# Patient Record
Sex: Male | Born: 1975 | Race: White | Hispanic: No | Marital: Married | Smoking: Former smoker
Health system: Southern US, Community
[De-identification: ages and names within clinical notes are randomized; demographics above are authoritative.]

## PROBLEM LIST (undated history)

## (undated) DIAGNOSIS — Z87442 Personal history of urinary calculi: Secondary | ICD-10-CM

## (undated) DIAGNOSIS — K219 Gastro-esophageal reflux disease without esophagitis: Secondary | ICD-10-CM

## (undated) DIAGNOSIS — I499 Cardiac arrhythmia, unspecified: Secondary | ICD-10-CM

## (undated) HISTORY — PX: EYE SURGERY: SHX253

## (undated) HISTORY — PX: KNEE SURGERY: SHX244

## (undated) HISTORY — PX: LITHOTRIPSY: SUR834

---

## 2010-05-18 ENCOUNTER — Ambulatory Visit: Payer: Self-pay

## 2010-05-26 ENCOUNTER — Ambulatory Visit: Payer: Self-pay | Admitting: Orthopedic Surgery

## 2011-03-20 ENCOUNTER — Ambulatory Visit: Payer: Self-pay | Admitting: Internal Medicine

## 2011-09-30 ENCOUNTER — Emergency Department: Payer: Self-pay | Admitting: Internal Medicine

## 2011-09-30 LAB — BASIC METABOLIC PANEL
Anion Gap: 11 (ref 7–16)
Calcium, Total: 8.8 mg/dL (ref 8.5–10.1)
Co2: 23 mmol/L (ref 21–32)
Creatinine: 1.01 mg/dL (ref 0.60–1.30)
EGFR (African American): >60
EGFR (Non-African Amer.): >60
Osmolality: 281 (ref 275–301)

## 2011-09-30 LAB — URINALYSIS, COMPLETE
Bacteria: NONE SEEN
Ketone: NEGATIVE
Leukocyte Esterase: NEGATIVE
Nitrite: NEGATIVE
Ph: 5 (ref 4.5–8.0)
Protein: NEGATIVE
RBC,UR: 6 /HPF (ref 0–5)
WBC UR: 2 /HPF (ref 0–5)

## 2011-09-30 LAB — CBC
MCH: 29 pg (ref 26.0–34.0)
MCHC: 33.9 g/dL (ref 32.0–36.0)
MCV: 86 fL (ref 80–100)
Platelet: 217 10*3/uL (ref 150–440)
RDW: 12.9 % (ref 11.5–14.5)

## 2011-10-16 ENCOUNTER — Ambulatory Visit: Payer: Self-pay | Admitting: Urology

## 2011-10-18 ENCOUNTER — Ambulatory Visit: Payer: Self-pay | Admitting: Urology

## 2011-10-18 ENCOUNTER — Emergency Department: Payer: Self-pay | Admitting: Emergency Medicine

## 2011-10-19 LAB — URINALYSIS, COMPLETE
Bacteria: NONE SEEN
Glucose,UR: NEGATIVE mg/dL (ref 0–75)
Ketone: NEGATIVE
Nitrite: NEGATIVE
RBC,UR: 18 /HPF (ref 0–5)
Specific Gravity: 1.005 (ref 1.003–1.030)
WBC UR: 1 /HPF (ref 0–5)

## 2011-10-19 LAB — CBC
HGB: 12.8 g/dL — ABNORMAL LOW (ref 13.0–18.0)
MCH: 27.9 pg (ref 26.0–34.0)
MCV: 85 fL (ref 80–100)
Platelet: 208 10*3/uL (ref 150–440)
RBC: 4.59 10*6/uL (ref 4.40–5.90)
RDW: 12.7 % (ref 11.5–14.5)

## 2011-10-19 LAB — COMPREHENSIVE METABOLIC PANEL
Albumin: 3.6 g/dL (ref 3.4–5.0)
Alkaline Phosphatase: 75 U/L (ref 50–136)
BUN: 15 mg/dL (ref 7–18)
Calcium, Total: 8.8 mg/dL (ref 8.5–10.1)
Co2: 27 mmol/L (ref 21–32)
Creatinine: 1.18 mg/dL (ref 0.60–1.30)
EGFR (Non-African Amer.): >60
Glucose: 119 mg/dL — ABNORMAL HIGH (ref 65–99)
Osmolality: 274 (ref 275–301)
SGPT (ALT): 43 U/L
Sodium: 136 mmol/L (ref 136–145)
Total Protein: 7.1 g/dL (ref 6.4–8.2)

## 2011-10-19 LAB — LIPASE, BLOOD: Lipase: 96 U/L (ref 73–393)

## 2013-09-22 IMAGING — CT CT STONE STUDY
1 of 2 series · 15 of 32 positions shown, 19 images · non-contrast
Comparison: none

REASON FOR EXAM: acute left flank pain radiating to testicle
COMMENTS:

PROCEDURE:     CT  - CT ABDOMEN /PELVIS WO (STONE)  - September 30, 2011  [DATE]
RESULT:     Comparison: None
TECHNIQUE: Multiple axial images from the lung bases to the symphysis pubis
were obtained without oral and without intravenous contrast.

[Series 2: 3mm soft tissue · axial · 0.79mm/px · z∈[-1126,-652]mm · 15 of 174 slices shown, 19 images]
[im 8/174  soft-tissue]
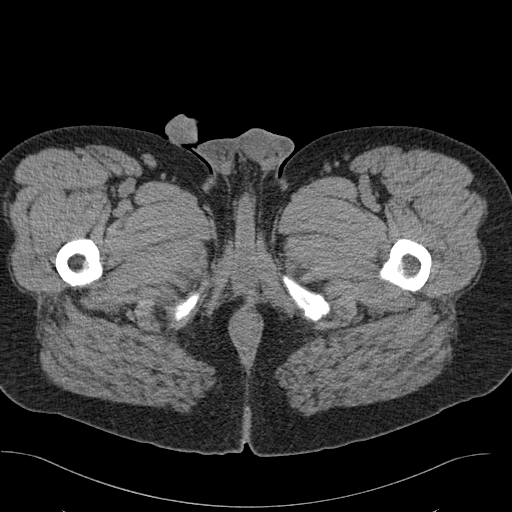
[im 8/174  bone]
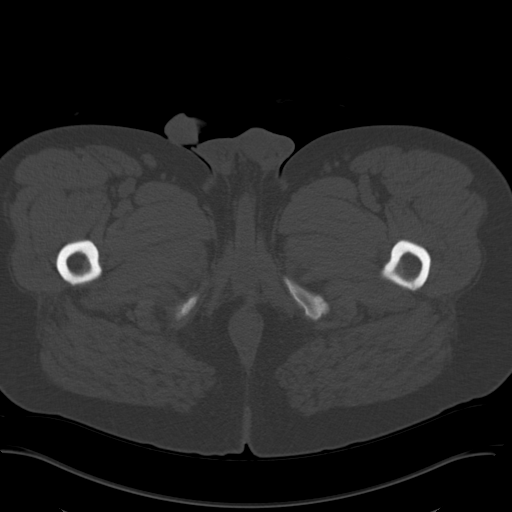
[im 22/174  soft-tissue]
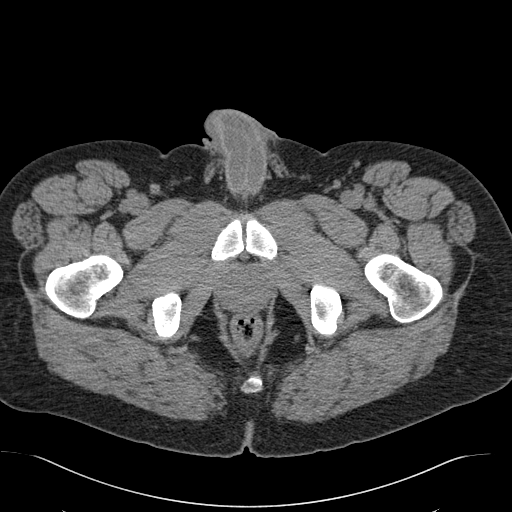
[im 37/174  soft-tissue]
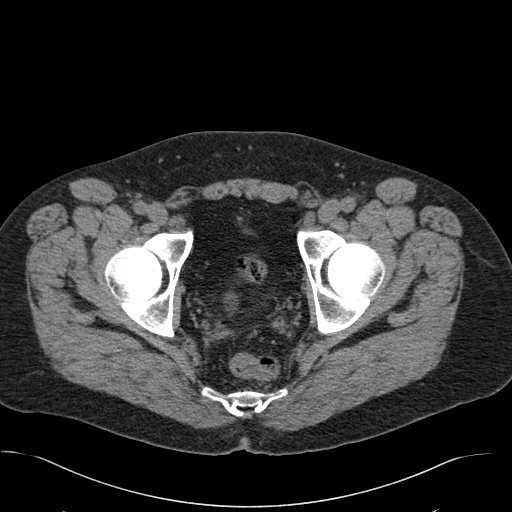
[im 51/174  soft-tissue]
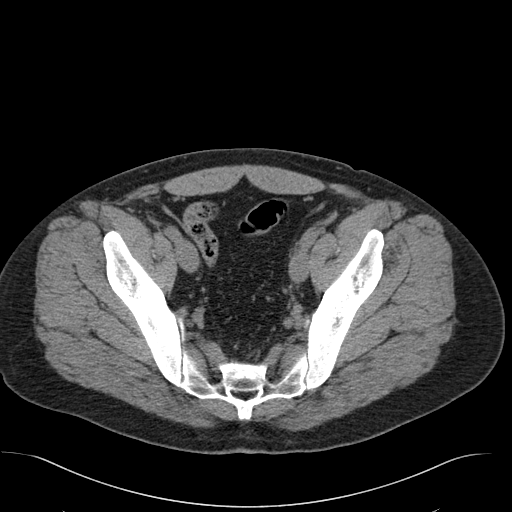
[im 58/174  soft-tissue]
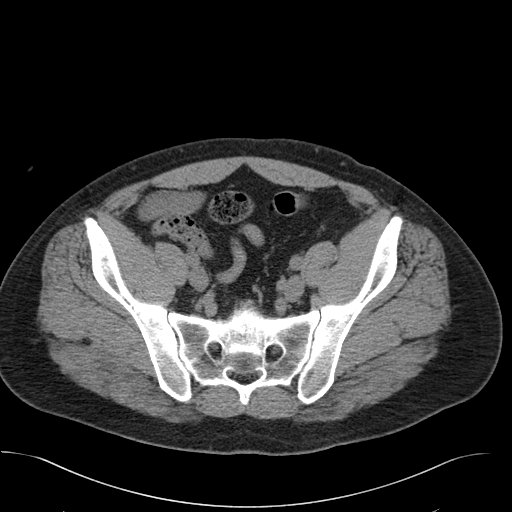
[im 73/174  soft-tissue]
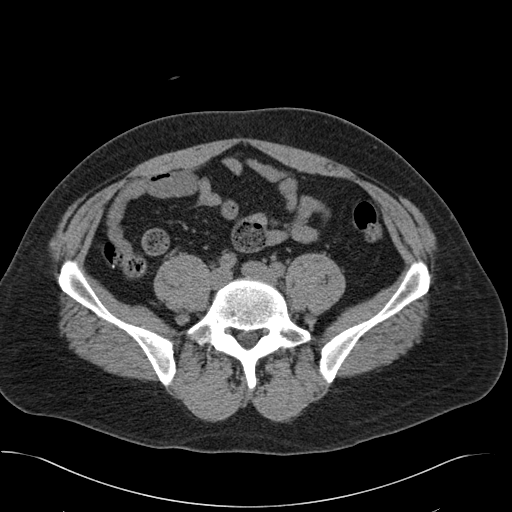
[im 87/174  soft-tissue]
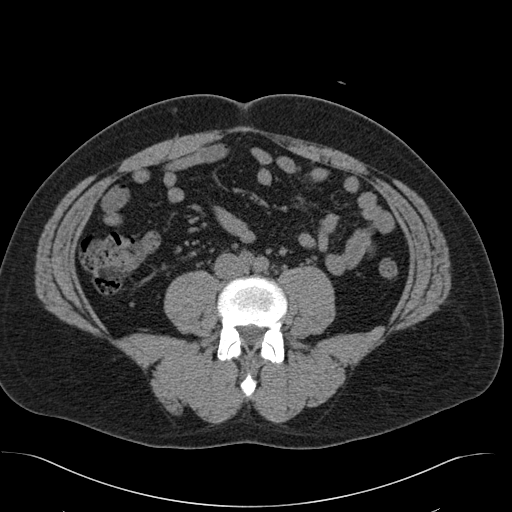
[im 101/174  soft-tissue]
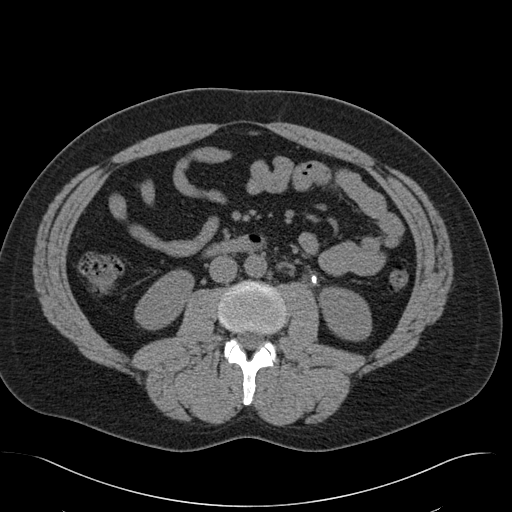
[im 116/174  soft-tissue]
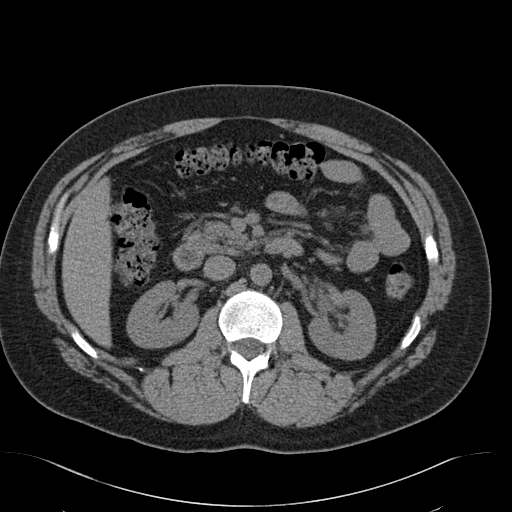
[im 116/174  bone]
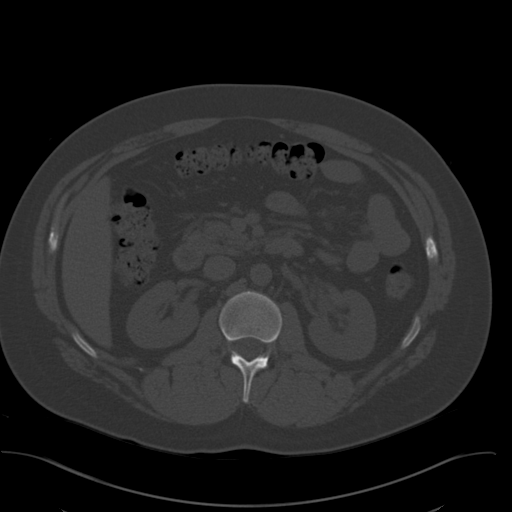
[im 123/174  soft-tissue]
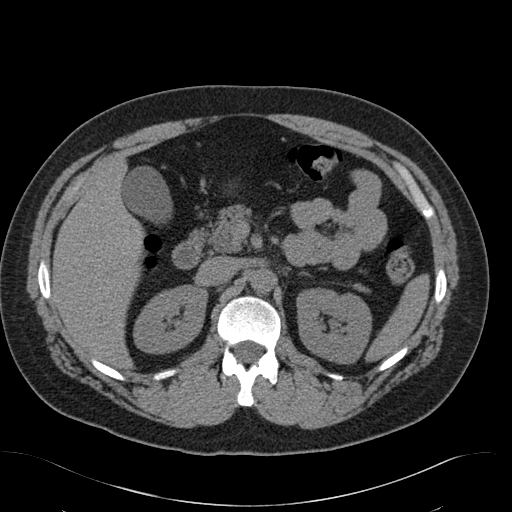
[im 137/174  soft-tissue]
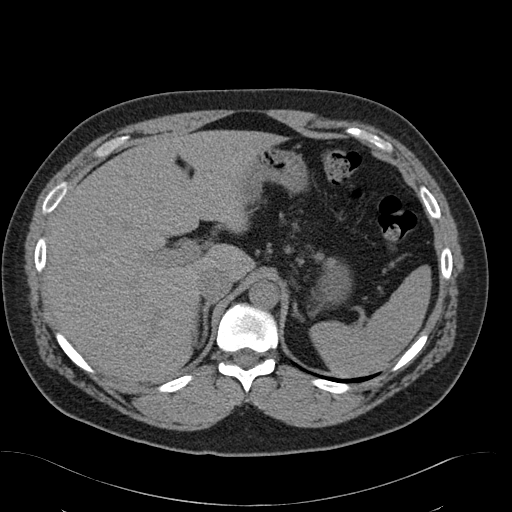
[im 145/174  lung]
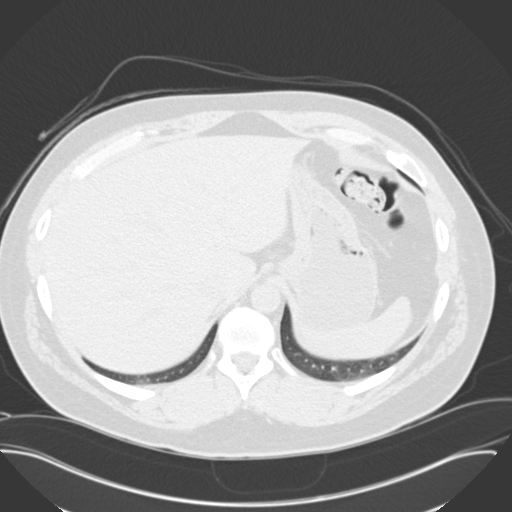
[im 152/174  soft-tissue]
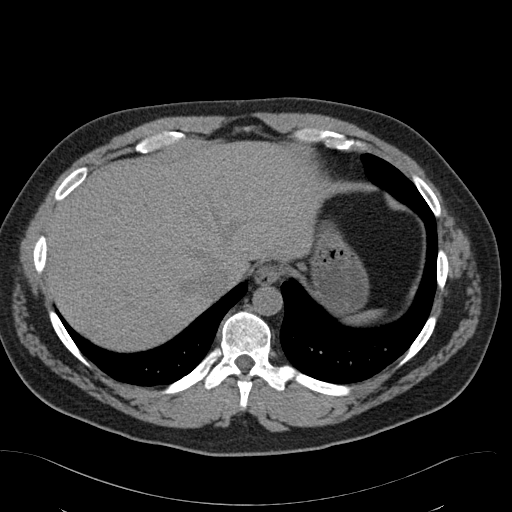
[im 152/174  lung]
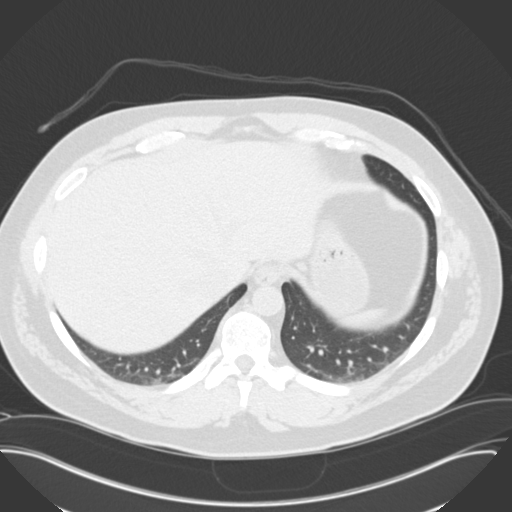
[im 159/174  lung]
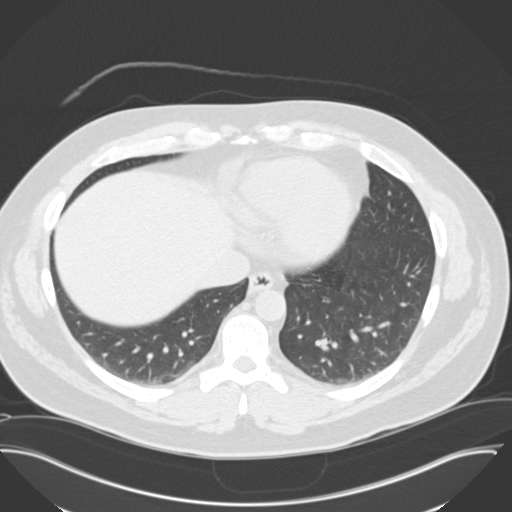
[im 166/174  soft-tissue]
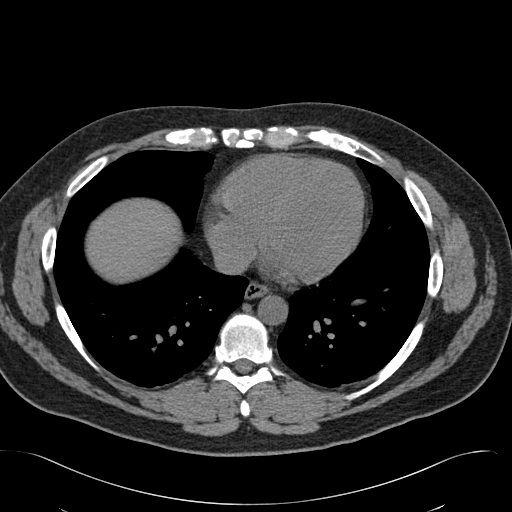
[im 166/174  lung]
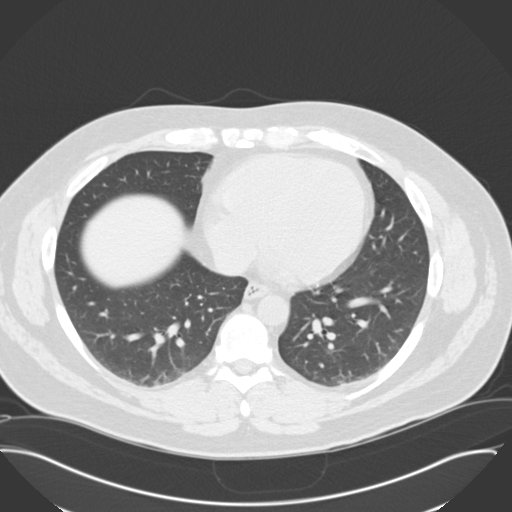

[15 of 32 positions shown; findings below may reference images not displayed]

FINDINGS: There is a 2 mm nodule along the left major fissure.

Lack of intravenous contrast limits evaluation of the solid abdominal
organs.  Grossly, the liver, gallbladder, spleen, adrenals, and pancreas are
unremarkable.

There is a 7 mm calculus in the proximal left ureter, just distal to the
left ureterovesical junction. There is mild dilatation of the left renal
pelvis. There is mild adjacent periureteral stranding which is likely
reactive.

The small and large bowel are normal in caliber. The appendix is normal in
caliber.

No aggressive lytic or sclerotic osseous lesions are identified.
IMPRESSION: There is a 7 mm calculus in the proximal left ureter causing mild
obstruction.

## 2017-06-17 ENCOUNTER — Ambulatory Visit (HOSPITAL_COMMUNITY)
Admission: EM | Admit: 2017-06-17 | Discharge: 2017-06-17 | Disposition: A | Discharge status: Home / Self Care | Payer: BLUE CROSS/BLUE SHIELD | Attending: Internal Medicine | Admitting: Internal Medicine

## 2017-06-17 ENCOUNTER — Encounter (HOSPITAL_COMMUNITY): Payer: Self-pay | Admitting: Family Medicine

## 2017-06-17 DIAGNOSIS — J22 Unspecified acute lower respiratory infection: Secondary | ICD-10-CM

## 2017-06-17 MED ORDER — AZITHROMYCIN 250 MG PO TABS: ORAL_TABLET | ORAL | 0 refills | Status: AC

## 2017-06-17 MED ORDER — PREDNISONE 20 MG PO TABS: 40.0000 mg | ORAL_TABLET | Freq: Every day | ORAL | 0 refills | Status: AC

## 2017-06-17 NOTE — Discharge Instructions (Signed)
Push fluids to ensure adequate hydration and keep secretions thin.  Tylenol and/or ibuprofen as needed for pain or fevers.  Complete course of antibiotics.  5 days of prednisone. May continue with over the counter treatments as needed, daily flonase may be helpful, mucinex etc. If symptoms worsen or do not improve in the next week to return to be seen or to follow up with your PCP.

## 2017-06-17 NOTE — ED Triage Notes (Signed)
Pt here for cough since last Tuesday. He saw his PCP on Thursday and was swabbed for flu which was negative. He has a cough that has persisted despite using day quil, night quil and mucinex. No fever. sts also lower back pain due to the cough.

## 2017-06-17 NOTE — ED Provider Notes (Signed)
[facility]    CSN: [id number] Arrival date & time: 06/17/17  1303     History   Chief Complaint Chief Complaint  Patient presents with  . Cough    HPI Omar Larson is a 42 y.o. male.   Omar Larson presents with complaints of persistent cough which started 1 week ago and has not improved. Keeping him up at night. Productive. Causing back soreness due to coughing. Taking dayquil and nyquil which have not helped. Short of breath and dizzy when coughing. No known fevers. Was swabbed negative for influenza last week. Occasional sore throat. Without ear pain or gi/gu complaints. No known ill contacts. Without history of asthma. Does not smoke.     ROS per HPI.       History reviewed. No pertinent past medical history.  There are no active problems to display for this patient.   History reviewed. No pertinent surgical history.     Home Medications    Prior to Admission medications   Medication Sig Start Date End Date Taking? Authorizing Provider  azithromycin (ZITHROMAX) 250 MG tablet Take 2 tablets (500 mg total) by mouth daily for 1 day, THEN 1 tablet (250 mg total) daily for 4 days. 06/17/17 06/22/17  Georgetta Haber, NP  predniSONE (DELTASONE) 20 MG tablet Take 2 tablets (40 mg total) by mouth daily with breakfast for 5 days. 06/17/17 06/22/17  Georgetta Haber, NP    Family History History reviewed. No pertinent family history.  Social History Social History   Tobacco Use  . Smoking status: Never Smoker  . Smokeless tobacco: Never Used  Substance Use Topics  . Alcohol use: Not on file  . Drug use: Not on file     Allergies   Patient has no known allergies.   Review of Systems Review of Systems   Physical Exam Triage Vital Signs ED Triage Vitals  Enc Vitals Group     BP 06/17/17 1405 137/65     Pulse Rate 06/17/17 1405 91     Resp 06/17/17 1405 18     Temp 06/17/17 1405 98.6 F (37 C)     Temp Source 06/17/17 1405 Oral     SpO2  06/17/17 1405 97 %     Weight --      Height --      Head Circumference --      Peak Flow --      Pain Score 06/17/17 1403 6     Pain Loc --      Pain Edu? --      Excl. in GC? --    No data found.  Updated Vital Signs BP 137/65   Pulse 91   Temp 98.6 F (37 C) (Oral)   Resp 18   SpO2 97%   Visual Acuity Right Eye Distance:   Left Eye Distance:   Bilateral Distance:    Right Eye Near:   Left Eye Near:    Bilateral Near:     Physical Exam  Constitutional: He is oriented to person, place, and time. He appears well-developed and well-nourished.  HENT:  Head: Normocephalic and atraumatic.  Right Ear: Tympanic membrane, external ear and ear canal normal.  Left Ear: Tympanic membrane, external ear and ear canal normal.  Nose: Nose normal. Right sinus exhibits no maxillary sinus tenderness and no frontal sinus tenderness. Left sinus exhibits no maxillary sinus tenderness and no frontal sinus tenderness.  Mouth/Throat: Uvula is midline, oropharynx is clear and moist and  mucous membranes are normal.  Eyes: Conjunctivae are normal. Pupils are equal, round, and reactive to light.  Neck: Normal range of motion.  Cardiovascular: Normal rate and regular rhythm.  Pulmonary/Chest: Effort normal and breath sounds normal.  Strong dry cough, exacerbated by deep breathing   Lymphadenopathy:    He has no cervical adenopathy.  Neurological: He is alert and oriented to person, place, and time.  Skin: Skin is warm and dry.  Vitals reviewed.    UC Treatments / Results  Labs (all labs ordered are listed, but only abnormal results are displayed) Labs Reviewed - No data to display  EKG  EKG Interpretation None       Radiology No results found.  Procedures Procedures (including critical care time)  Medications Ordered in UC Medications - No data to display   Initial Impression / Assessment and Plan / UC Course  I have reviewed the triage vital signs and the nursing  notes.  Pertinent labs & imaging results that were available during my care of the patient were reviewed by me and considered in my medical decision making (see chart for details).     1 week of persistent cough, azithromycin provided, 5 days of prednisone. Continue with supportive cares. If symptoms worsen or do not improve in the next week to return to be seen or to follow up with PCP.  Patient verbalized understanding and agreeable to plan.    Final Clinical Impressions(s) / UC Diagnoses   Final diagnoses:  Lower respiratory tract infection    ED Discharge Orders        Ordered    azithromycin (ZITHROMAX) 250 MG tablet     06/17/17 1451    predniSONE (DELTASONE) 20 MG tablet  Daily with breakfast     06/17/17 1451       Controlled Substance Prescriptions [place] Controlled Substance Registry consulted? Not Applicable   Georgetta HaberBurky, Natalie B, NP 06/17/17 1457

## 2018-02-06 ENCOUNTER — Other Ambulatory Visit: Payer: Self-pay | Admitting: Orthopedic Surgery

## 2018-02-11 ENCOUNTER — Other Ambulatory Visit: Payer: Self-pay

## 2018-02-11 ENCOUNTER — Other Ambulatory Visit: Payer: BLUE CROSS/BLUE SHIELD

## 2018-02-11 ENCOUNTER — Encounter
Admission: RE | Admit: 2018-02-11 | Discharge: 2018-02-11 | Disposition: A | Discharge status: Home / Self Care | Payer: BLUE CROSS/BLUE SHIELD | Source: Ambulatory Visit | Attending: Orthopedic Surgery | Admitting: Orthopedic Surgery

## 2018-02-11 HISTORY — DX: Cardiac arrhythmia, unspecified: I49.9

## 2018-02-11 HISTORY — DX: Gastro-esophageal reflux disease without esophagitis: K21.9

## 2018-02-11 HISTORY — DX: Personal history of urinary calculi: Z87.442

## 2018-02-11 NOTE — Patient Instructions (Signed)
Your procedure is scheduled on: 02-13-18 THURSDAY Report to Same Day Surgery 2nd floor medical mall [facility] Entrance-take elevator on left to 2nd floor.  Check in with surgery information desk.) To find out your arrival time please call [phone] between 1PM - 3PM on 02-12-18 [facility]  Remember: Instructions that are not followed completely may result in serious medical risk, up to and including death, or upon the discretion of your surgeon and anesthesiologist your surgery may need to be rescheduled.    _x___ 1. Do not eat food after midnight the night before your procedure. NO GUM OR CANDY AFTER MIDNIGHT.  You may drink clear liquids up to 2 hours before you are scheduled to arrive at the hospital for your procedure.  Do not drink clear liquids within 2 hours of your scheduled arrival to the hospital.  Clear liquids include  --Water or Apple juice without pulp  --Clear carbohydrate beverage such as ClearFast or Gatorade  --Black Coffee or Clear Tea (No milk, no creamers, do not add anything to the coffee or Tea   ____Ensure clear carbohydrate drink on the way to the hospital for bariatric patients  ____Ensure clear carbohydrate drink 3 hours before surgery for Dr Rutherford Nail patients if physician instructed.     __x__ 2. No Alcohol for 24 hours before or after surgery.   __x__3. No Smoking or e-cigarettes for 24 prior to surgery.  Do not use any chewable tobacco products for at least 6 hour prior to surgery   ____  4. Bring all medications with you on the day of surgery if instructed.    __x__ 5. Notify your doctor if there is any change in your medical condition     (cold, fever, infections).    x___6. On the morning of surgery brush your teeth with toothpaste and water.  You may rinse your mouth with mouth wash if you wish.  Do not swallow any toothpaste or mouthwash.   Do not wear jewelry, make-up, hairpins, clips or nail polish.  Do not wear lotions, powders, or  perfumes. You may wear deodorant.  Do not shave 48 hours prior to surgery. Men may shave face and neck.  Do not bring valuables to the hospital.    [facility] is not responsible for any belongings or valuables.               Contacts, dentures or bridgework may not be worn into surgery.  Leave your suitcase in the car. After surgery it may be brought to your room.  For patients admitted to the hospital, discharge time is determined by your treatment team.  _  Patients discharged the day of surgery will not be allowed to drive home.  You will need someone to drive you home and stay with you the night of your procedure.    Please read over the following fact sheets that you were given:   [facility] Preparing for Surgery    ____ Take anti-hypertensive listed below, cardiac, seizure, asthma, anti-reflux and psychiatric medicines. These include:  1. NONE  2.  3.  4.  5.  6.  ____Fleets enema or Magnesium Citrate as directed.   _x___ Use CHG Soap or sage wipes as directed on instruction sheet   ____ Use inhalers on the day of surgery and bring to hospital day of surgery  ____ Stop Metformin and Janumet 2 days prior to surgery.    ____ Take 1/2 of usual insulin dose the night before surgery and  none on the morning surgery.   ____ Follow recommendations from Cardiologist, Pulmonologist or PCP regarding stopping Aspirin, Coumadin, Plavix ,Eliquis, Effient, or Pradaxa, and Pletal.  X____Stop Anti-inflammatories such as Advil, Aleve, Ibuprofen, Motrin, Naproxen, Naprosyn, Goodies powders or aspirin products NOW-OK to take Tylenol   _x___ Stop supplements until after surgery-STOP GLUCOSAMINE-CHONDROITIN AND FISH OIL NOW-MAY RESUME AFTER SURGERY   ____ Bring C-Pap to the hospital.

## 2018-02-12 ENCOUNTER — Encounter
Admission: RE | Admit: 2018-02-12 | Discharge: 2018-02-12 | Disposition: A | Discharge status: Home / Self Care | Payer: BLUE CROSS/BLUE SHIELD | Source: Ambulatory Visit | Attending: Orthopedic Surgery | Admitting: Orthopedic Surgery

## 2018-02-12 DIAGNOSIS — Z01818 Encounter for other preprocedural examination: Secondary | ICD-10-CM | POA: Insufficient documentation

## 2018-02-12 LAB — BASIC METABOLIC PANEL
Anion gap: 7 (ref 5–15)
BUN: 17 mg/dL (ref 6–20)
CO2: 26 mmol/L (ref 22–32)
CREATININE: 0.88 mg/dL (ref 0.61–1.24)
Calcium: 9.3 mg/dL (ref 8.9–10.3)
Chloride: 106 mmol/L (ref 98–111)
GFR calc Af Amer: >60 mL/min (ref >60)
GFR calc non Af Amer: >60 mL/min (ref >60)
GLUCOSE: 104 mg/dL — AB (ref 70–99)
Potassium: 4.2 mmol/L (ref 3.5–5.1)
Sodium: 139 mmol/L (ref 135–145)

## 2018-02-12 LAB — CBC WITH DIFFERENTIAL/PLATELET
Abs Immature Granulocytes: 0.01 10*3/uL (ref 0.00–0.07)
Basophils Absolute: 0.1 10*3/uL (ref 0.0–0.1)
Basophils Relative: 1 %
EOS ABS: 0.2 10*3/uL (ref 0.0–0.5)
EOS PCT: 3 %
HEMATOCRIT: 47.3 % (ref 39.0–52.0)
Hemoglobin: 15.4 g/dL (ref 13.0–17.0)
Immature Granulocytes: 0 %
LYMPHS ABS: 1.6 10*3/uL (ref 0.7–4.0)
Lymphocytes Relative: 29 %
MCH: 29.3 pg (ref 26.0–34.0)
MCHC: 32.6 g/dL (ref 30.0–36.0)
MCV: 89.9 fL (ref 80.0–100.0)
MONOS PCT: 10 %
Monocytes Absolute: 0.5 10*3/uL (ref 0.1–1.0)
Neutro Abs: 3.1 10*3/uL (ref 1.7–7.7)
Neutrophils Relative %: 57 %
Platelets: 268 10*3/uL (ref 150–400)
RBC: 5.26 MIL/uL (ref 4.22–5.81)
RDW: 12.7 % (ref 11.5–15.5)
WBC: 5.5 10*3/uL (ref 4.0–10.5)
nRBC: 0 % (ref 0.0–0.2)

## 2018-02-12 LAB — PROTIME-INR
INR: 0.89
Prothrombin Time: 12 seconds (ref 11.4–15.2)

## 2018-02-12 LAB — APTT: aPTT: 28 seconds (ref 24–36)

## 2018-02-12 MED ORDER — CEFAZOLIN SODIUM-DEXTROSE 2-4 GM/100ML-% IV SOLN
2.0000 g | INTRAVENOUS | Status: AC
  Administered 2018-02-13: 2 g via INTRAVENOUS

## 2018-02-13 ENCOUNTER — Encounter
Admission: RE | Disposition: A | Discharge status: Home / Self Care | Payer: Self-pay | Source: Ambulatory Visit | Attending: Orthopedic Surgery

## 2018-02-13 ENCOUNTER — Ambulatory Visit: Payer: BLUE CROSS/BLUE SHIELD | Admitting: Anesthesiology

## 2018-02-13 ENCOUNTER — Other Ambulatory Visit: Payer: Self-pay

## 2018-02-13 ENCOUNTER — Ambulatory Visit
Admission: RE | Admit: 2018-02-13 | Discharge: 2018-02-13 | Disposition: A | Discharge status: Home / Self Care | Payer: BLUE CROSS/BLUE SHIELD | Source: Ambulatory Visit | Attending: Orthopedic Surgery | Admitting: Orthopedic Surgery

## 2018-02-13 DIAGNOSIS — S29011A Strain of muscle and tendon of front wall of thorax, initial encounter: Secondary | ICD-10-CM | POA: Diagnosis not present

## 2018-02-13 DIAGNOSIS — X58XXXA Exposure to other specified factors, initial encounter: Secondary | ICD-10-CM | POA: Insufficient documentation

## 2018-02-13 DIAGNOSIS — Z791 Long term (current) use of non-steroidal anti-inflammatories (NSAID): Secondary | ICD-10-CM | POA: Insufficient documentation

## 2018-02-13 DIAGNOSIS — Z87891 Personal history of nicotine dependence: Secondary | ICD-10-CM | POA: Insufficient documentation

## 2018-02-13 HISTORY — PX: TRICEPS TENDON REPAIR: SHX2577

## 2018-02-13 SURGERY — REPAIR, TENDON, TRICEPS
Anesthesia: General | Site: Shoulder | Laterality: Left

## 2018-02-13 MED ORDER — ROCURONIUM BROMIDE 50 MG/5ML IV SOLN
INTRAVENOUS | Status: AC
  Filled 2018-02-13: qty 1

## 2018-02-13 MED ORDER — FAMOTIDINE 20 MG PO TABS
ORAL_TABLET | ORAL | Status: AC
  Administered 2018-02-13: 20 mg
  Filled 2018-02-13: qty 1

## 2018-02-13 MED ORDER — ONDANSETRON HCL 4 MG/2ML IJ SOLN
INTRAMUSCULAR | Status: AC
  Filled 2018-02-13: qty 2

## 2018-02-13 MED ORDER — ONDANSETRON HCL 4 MG/2ML IJ SOLN
INTRAMUSCULAR | Status: DC | PRN
  Administered 2018-02-13: 4 mg via INTRAVENOUS

## 2018-02-13 MED ORDER — LACTATED RINGERS IV SOLN
INTRAVENOUS | Status: DC
  Administered 2018-02-13: 09:00:00 via INTRAVENOUS

## 2018-02-13 MED ORDER — FAMOTIDINE 20 MG PO TABS: 20.0000 mg | ORAL_TABLET | Freq: Once | ORAL | Status: DC

## 2018-02-13 MED ORDER — ONDANSETRON HCL 4 MG PO TABS: 4.0000 mg | ORAL_TABLET | Freq: Three times a day (TID) | ORAL | 0 refills | Status: DC | PRN

## 2018-02-13 MED ORDER — SUGAMMADEX SODIUM 200 MG/2ML IV SOLN
INTRAVENOUS | Status: DC | PRN
  Administered 2018-02-13: 234.2 mg via INTRAVENOUS

## 2018-02-13 MED ORDER — OXYCODONE HCL 5 MG PO TABS: 5.0000 mg | ORAL_TABLET | Freq: Once | ORAL | Status: DC | PRN

## 2018-02-13 MED ORDER — ACETAMINOPHEN 10 MG/ML IV SOLN
INTRAVENOUS | Status: DC | PRN
  Administered 2018-02-13: 1000 mg via INTRAVENOUS

## 2018-02-13 MED ORDER — CEFAZOLIN SODIUM-DEXTROSE 2-4 GM/100ML-% IV SOLN
INTRAVENOUS | Status: AC
  Filled 2018-02-13: qty 100

## 2018-02-13 MED ORDER — SODIUM CHLORIDE 0.9 % IV SOLN
INTRAVENOUS | Status: DC | PRN
  Administered 2018-02-13: 15 ug/min via INTRAVENOUS

## 2018-02-13 MED ORDER — HYDROMORPHONE HCL 1 MG/ML IJ SOLN
0.2500 mg | INTRAMUSCULAR | Status: DC | PRN
  Administered 2018-02-13 (×3): 0.25 mg via INTRAVENOUS

## 2018-02-13 MED ORDER — HYDROMORPHONE HCL 1 MG/ML IJ SOLN
INTRAMUSCULAR | Status: AC
  Filled 2018-02-13: qty 1

## 2018-02-13 MED ORDER — NEOMYCIN-POLYMYXIN B GU 40-200000 IR SOLN
Status: AC
  Filled 2018-02-13: qty 20

## 2018-02-13 MED ORDER — OXYCODONE HCL 5 MG/5ML PO SOLN: 5.0000 mg | Freq: Once | ORAL | Status: DC | PRN

## 2018-02-13 MED ORDER — MIDAZOLAM HCL 2 MG/2ML IJ SOLN
INTRAMUSCULAR | Status: AC
  Filled 2018-02-13: qty 2

## 2018-02-13 MED ORDER — NEOMYCIN-POLYMYXIN B GU 40-200000 IR SOLN
Status: DC | PRN
  Administered 2018-02-13: 2 mL
  Administered 2018-02-13: 4 mL

## 2018-02-13 MED ORDER — FENTANYL CITRATE (PF) 100 MCG/2ML IJ SOLN
INTRAMUSCULAR | Status: DC | PRN
  Administered 2018-02-13: 100 ug via INTRAVENOUS
  Administered 2018-02-13: 50 ug via INTRAVENOUS
  Administered 2018-02-13: 25 ug via INTRAVENOUS
  Administered 2018-02-13: 50 ug via INTRAVENOUS
  Administered 2018-02-13: 25 ug via INTRAVENOUS

## 2018-02-13 MED ORDER — LIDOCAINE HCL (CARDIAC) PF 100 MG/5ML IV SOSY
PREFILLED_SYRINGE | INTRAVENOUS | Status: DC | PRN
  Administered 2018-02-13: 100 mg via INTRAVENOUS

## 2018-02-13 MED ORDER — ROCURONIUM BROMIDE 100 MG/10ML IV SOLN
INTRAVENOUS | Status: DC | PRN
  Administered 2018-02-13: 10 mg via INTRAVENOUS
  Administered 2018-02-13 (×2): 20 mg via INTRAVENOUS
  Administered 2018-02-13: 5 mg via INTRAVENOUS
  Administered 2018-02-13: 10 mg via INTRAVENOUS
  Administered 2018-02-13: 30 mg via INTRAVENOUS
  Administered 2018-02-13: 50 mg via INTRAVENOUS

## 2018-02-13 MED ORDER — OXYCODONE HCL 5 MG PO TABS: 5.0000 mg | ORAL_TABLET | ORAL | 0 refills | Status: DC | PRN

## 2018-02-13 MED ORDER — MIDAZOLAM HCL 2 MG/2ML IJ SOLN
INTRAMUSCULAR | Status: DC | PRN
  Administered 2018-02-13: 2 mg via INTRAVENOUS

## 2018-02-13 MED ORDER — SUGAMMADEX SODIUM 200 MG/2ML IV SOLN
INTRAVENOUS | Status: AC
  Filled 2018-02-13: qty 2

## 2018-02-13 MED ORDER — CHLORHEXIDINE GLUCONATE CLOTH 2 % EX PADS: 6.0000 | MEDICATED_PAD | Freq: Once | CUTANEOUS | Status: DC

## 2018-02-13 MED ORDER — PROPOFOL 10 MG/ML IV BOLUS
INTRAVENOUS | Status: DC | PRN
  Administered 2018-02-13: 200 mg via INTRAVENOUS
  Administered 2018-02-13: 40 mg via INTRAVENOUS
  Administered 2018-02-13: 20 mg via INTRAVENOUS

## 2018-02-13 MED ORDER — DEXAMETHASONE SODIUM PHOSPHATE 10 MG/ML IJ SOLN
INTRAMUSCULAR | Status: DC | PRN
  Administered 2018-02-13: 10 mg via INTRAVENOUS

## 2018-02-13 MED ORDER — ACETAMINOPHEN 10 MG/ML IV SOLN
INTRAVENOUS | Status: AC
  Filled 2018-02-13: qty 100

## 2018-02-13 MED ORDER — PROMETHAZINE HCL 25 MG/ML IJ SOLN
12.5000 mg | INTRAMUSCULAR | Status: DC | PRN
  Administered 2018-02-13: 12.5 mg via INTRAVENOUS

## 2018-02-13 MED ORDER — FENTANYL CITRATE (PF) 250 MCG/5ML IJ SOLN
INTRAMUSCULAR | Status: AC
  Filled 2018-02-13: qty 5

## 2018-02-13 MED ORDER — OXYCODONE HCL 5 MG PO TABS
5.0000 mg | ORAL_TABLET | Freq: Once | ORAL | Status: AC
  Administered 2018-02-13: 5 mg via ORAL

## 2018-02-13 MED ORDER — OXYCODONE HCL 5 MG PO TABS
ORAL_TABLET | ORAL | Status: AC
  Filled 2018-02-13: qty 1

## 2018-02-13 MED ORDER — PROPOFOL 10 MG/ML IV BOLUS
INTRAVENOUS | Status: AC
  Filled 2018-02-13: qty 40

## 2018-02-13 MED ORDER — DEXAMETHASONE SODIUM PHOSPHATE 10 MG/ML IJ SOLN
INTRAMUSCULAR | Status: AC
  Filled 2018-02-13: qty 1

## 2018-02-13 MED ORDER — PROMETHAZINE HCL 25 MG/ML IJ SOLN
INTRAMUSCULAR | Status: AC
  Administered 2018-02-13: 12.5 mg via INTRAVENOUS
  Filled 2018-02-13: qty 1

## 2018-02-13 MED ORDER — OXYCODONE HCL 5 MG PO TABS
5.0000 mg | ORAL_TABLET | ORAL | Status: DC | PRN
  Administered 2018-02-13: 5 mg via ORAL

## 2018-02-13 MED ORDER — HYDROMORPHONE HCL 1 MG/ML IJ SOLN
INTRAMUSCULAR | Status: AC
  Administered 2018-02-13: 0.25 mg via INTRAVENOUS
  Filled 2018-02-13: qty 1

## 2018-02-13 MED ORDER — SODIUM CHLORIDE FLUSH 0.9 % IV SOLN
INTRAVENOUS | Status: AC
  Filled 2018-02-13: qty 10

## 2018-02-13 MED ORDER — SEVOFLURANE IN SOLN
RESPIRATORY_TRACT | Status: AC
  Filled 2018-02-13: qty 250

## 2018-02-13 MED ORDER — LIDOCAINE HCL (PF) 2 % IJ SOLN
INTRAMUSCULAR | Status: AC
  Filled 2018-02-13: qty 10

## 2018-02-13 SURGICAL SUPPLY — 76 items
BANDAGE ACE 4X5 VEL STRL LF (GAUZE/BANDAGES/DRESSINGS) ×3 IMPLANT
BLADE SURG 15 STRL LF DISP TIS (BLADE) ×1 IMPLANT
BLADE SURG 15 STRL SS (BLADE) ×2
BLADE SURG SZ10 CARB STEEL (BLADE) ×6 IMPLANT
BNDG COHESIVE 4X5 TAN STRL (GAUZE/BANDAGES/DRESSINGS) ×3 IMPLANT
BNDG ESMARK 4X12 TAN STRL LF (GAUZE/BANDAGES/DRESSINGS) ×3 IMPLANT
BUR 4.8X51.2 (BURR) ×3 IMPLANT
CANISTER SUCT 1200ML W/VALVE (MISCELLANEOUS) ×3 IMPLANT
CLOSURE WOUND 1/2 X4 (GAUZE/BANDAGES/DRESSINGS) ×2
COOLER POLAR GLACIER W/PUMP (MISCELLANEOUS) ×3 IMPLANT
COVER WAND RF STERILE (DRAPES) IMPLANT
CUFF DUAL TOURNIQUET 18IN DISP (TOURNIQUET CUFF) IMPLANT
CUFF TOURN 24 STER (MISCELLANEOUS) IMPLANT
DRAPE IMP U-DRAPE 54X76 (DRAPES) ×6 IMPLANT
DRAPE INCISE IOBAN 66X45 STRL (DRAPES) ×3 IMPLANT
DRSG OPSITE POSTOP 4X6 (GAUZE/BANDAGES/DRESSINGS) ×3 IMPLANT
DRSG TEGADERM 6X8 (GAUZE/BANDAGES/DRESSINGS) ×3 IMPLANT
DURAPREP 26ML APPLICATOR (WOUND CARE) ×12 IMPLANT
ELECT REM PT RETURN 9FT ADLT (ELECTROSURGICAL) ×3
ELECTRODE REM PT RTRN 9FT ADLT (ELECTROSURGICAL) ×1 IMPLANT
FIBER TAPE 2MM (SUTURE) ×12 IMPLANT
GAUZE PETRO XEROFOAM 1X8 (MISCELLANEOUS) ×3 IMPLANT
GAUZE SPONGE 4X4 12PLY STRL (GAUZE/BANDAGES/DRESSINGS) ×3 IMPLANT
GLOVE BIOGEL PI IND STRL 7.0 (GLOVE) ×5 IMPLANT
GLOVE BIOGEL PI IND STRL 9 (GLOVE) ×1 IMPLANT
GLOVE BIOGEL PI INDICATOR 7.0 (GLOVE) ×10
GLOVE BIOGEL PI INDICATOR 9 (GLOVE) ×2
GLOVE SURG 9.0 ORTHO LTXF (GLOVE) ×9 IMPLANT
GOWN STRL REUS TWL 2XL XL LVL4 (GOWN DISPOSABLE) ×3 IMPLANT
GOWN STRL REUS W/ TWL LRG LVL3 (GOWN DISPOSABLE) ×4 IMPLANT
GOWN STRL REUS W/TWL LRG LVL3 (GOWN DISPOSABLE) ×8
KIT STABILIZATION SHOULDER (MISCELLANEOUS) ×3 IMPLANT
KIT TURNOVER KIT A (KITS) ×3 IMPLANT
LOOP RED MAXI  1X406MM (MISCELLANEOUS) ×2
LOOP VESSEL MAXI 1X406 RED (MISCELLANEOUS) ×1 IMPLANT
MASK FACE SPIDER DISP (MASK) ×3 IMPLANT
MAT ABSORB  FLUID 56X50 GRAY (MISCELLANEOUS) ×2
MAT ABSORB FLUID 56X50 GRAY (MISCELLANEOUS) ×1 IMPLANT
NDL MAYO CATGUT SZ1 (NEEDLE) ×3
NDL MAYO CATGUT SZ5 (NEEDLE)
NDL SUT 5 .5 CRC TPR PNT MAYO (NEEDLE) IMPLANT
NEEDLE FILTER BLUNT 18X 1/2SAF (NEEDLE) ×2
NEEDLE FILTER BLUNT 18X1 1/2 (NEEDLE) ×1 IMPLANT
NEEDLE MAYO CATGUT SZ1 (NEEDLE) ×1 IMPLANT
NEEDLE MAYO CATGUT SZ4 (NEEDLE) ×3 IMPLANT
NEEDLE REVERSE CUT 1/2 CRC (NEEDLE) ×3 IMPLANT
NS IRRIG 1000ML POUR BTL (IV SOLUTION) ×3 IMPLANT
NS IRRIG 500ML POUR BTL (IV SOLUTION) ×3 IMPLANT
PACK EXTREMITY ARMC (MISCELLANEOUS) ×3 IMPLANT
PAD ABD DERMACEA PRESS 5X9 (GAUZE/BANDAGES/DRESSINGS) ×3 IMPLANT
PAD WRAPON POLAR SHDR XLG (MISCELLANEOUS) ×1 IMPLANT
PADDING CAST BLEND 4X4 NS (MISCELLANEOUS) ×3 IMPLANT
PASSER SUT SWANSON 36MM LOOP (INSTRUMENTS) IMPLANT
SPLINT CAST 1 STEP 4X30 (MISCELLANEOUS) IMPLANT
SPONGE LAP 18X18 RF (DISPOSABLE) ×3 IMPLANT
STAPLER SKIN PROX 35W (STAPLE) ×3 IMPLANT
STOCKINETTE IMPERVIOUS 9X36 MD (GAUZE/BANDAGES/DRESSINGS) ×3 IMPLANT
STRIP CLOSURE SKIN 1/2X4 (GAUZE/BANDAGES/DRESSINGS) ×4 IMPLANT
SUT FIBERWIRE #2 38 T-5 BLUE (SUTURE)
SUT FIBERWIRE #5 38 BLUE (WIRE) ×3 IMPLANT
SUT MNCRL 4-0 (SUTURE) ×2
SUT MNCRL 4-0 27XMFL (SUTURE) ×1
SUT VIC AB 0 CT1 36 (SUTURE) ×3 IMPLANT
SUT VIC AB 2-0 CT1 27 (SUTURE) ×2
SUT VIC AB 2-0 CT1 TAPERPNT 27 (SUTURE) ×1 IMPLANT
SUT VIC AB 3-0 SH 27 (SUTURE) ×2
SUT VIC AB 3-0 SH 27X BRD (SUTURE) ×1 IMPLANT
SUTURE FIBERWR #2 38 T-5 BLUE (SUTURE) IMPLANT
SUTURE MNCRL 4-0 27XMF (SUTURE) ×1 IMPLANT
SUTURE TAPE 1.3 40 TPR END (SUTURE) ×1 IMPLANT
SUTURETAPE 1.3 40 TPR END (SUTURE) ×3
SWABSTK COMLB BENZOIN TINCTURE (MISCELLANEOUS) ×3 IMPLANT
SYR 5ML LL (SYRINGE) ×3 IMPLANT
SYS IMPLANT DELIVERY PEC (Orthopedic Implant) ×6 IMPLANT
SYSTEM IMPLANT DELIVERY PEC (Orthopedic Implant) ×2 IMPLANT
WRAPON POLAR PAD SHDR XLG (MISCELLANEOUS) ×3

## 2018-02-13 NOTE — Anesthesia Preprocedure Evaluation (Addendum)
Anesthesia Evaluation  Patient identified by MRN, date of birth, ID band Patient awake    Reviewed: Allergy & Precautions, H&P , NPO status , Patient's Chart, lab work & pertinent test results  History of Anesthesia Complications History of anesthetic complications: remembers ETT being pulled during previous anesthetic, found this distressing.  Airway Mallampati: I  TM Distance: >3 FB Neck ROM: full   Comment: beard Dental  (+) Teeth Intact   Pulmonary neg pulmonary ROS, former smoker,    breath sounds clear to auscultation       Cardiovascular negative cardio ROS   Rhythm:regular Rate:Normal     Neuro/Psych negative neurological ROS  negative psych ROS   GI/Hepatic negative GI ROS, Neg liver ROS, GERD  ,  Endo/Other  negative endocrine ROS  Renal/GU      Musculoskeletal   Abdominal   Peds  Hematology negative hematology ROS (+)   Anesthesia Other Findings Past Medical History: No date: GERD (gastroesophageal reflux disease)     Comment:  OCC No date: History of kidney stones No date: Irregular heart beat  Past Surgical History: No date: EYE SURGERY     Comment:  LASIK No date: KNEE SURGERY No date: LITHOTRIPSY     Reproductive/Obstetrics negative OB ROS                            Anesthesia Physical Anesthesia Plan  ASA: II  Anesthesia Plan: General ETT   Post-op Pain Management:    Induction:   PONV Risk Score and Plan: Ondansetron, Dexamethasone and Midazolam  Airway Management Planned:   Additional Equipment:   Intra-op Plan:   Post-operative Plan:   Informed Consent: I have reviewed the patients History and Physical, chart, labs and discussed the procedure including the risks, benefits and alternatives for the proposed anesthesia with the patient or authorized representative who has indicated his/her understanding and acceptance.   Dental Advisory  Given  Plan Discussed with: Anesthesiologist  Anesthesia Plan Comments:        Anesthesia Quick Evaluation

## 2018-02-13 NOTE — OR Nursing (Signed)
Advises pain 2/10, to BR for void, toler well.  Advises "I want to go home".  D/C'd to home.

## 2018-02-13 NOTE — Anesthesia Procedure Notes (Signed)
Procedure Name: Intubation Performed by: Demetrius Charity, CRNA Pre-anesthesia Checklist: Patient identified, Patient being monitored, Timeout performed, Emergency Drugs available and Suction available Patient Re-evaluated:Patient Re-evaluated prior to induction Oxygen Delivery Method: Circle system utilized Preoxygenation: Pre-oxygenation with 100% oxygen Induction Type: IV induction Ventilation: Mask ventilation without difficulty Laryngoscope Size: Mac and 4 Grade View: Grade II Tube type: Oral Tube size: 7.5 mm Number of attempts: 1 Airway Equipment and Method: Stylet Placement Confirmation: ETT inserted through vocal cords under direct vision,  positive ETCO2 and breath sounds checked- equal and bilateral Secured at: 24 cm Tube secured with: Tape Dental Injury: Teeth and Oropharynx as per pre-operative assessment

## 2018-02-13 NOTE — Transfer of Care (Signed)
Immediate Anesthesia Transfer of Care Note  Patient: Omar Larson  Procedure(s) Performed: Felizardo HoffmannRICEPS /BICEPS TENDON REPAIR (Left Shoulder)  Patient Location: PACU  Anesthesia Type:General  Level of Consciousness: drowsy  Airway & Oxygen Therapy: Patient Spontanous Breathing and Patient connected to face mask oxygen  Post-op Assessment: Report given to RN and Post -op Vital signs reviewed and stable  Post vital signs: Reviewed and stable  Last Vitals:  Vitals Value Taken Time  BP 121/89 02/13/2018 12:22 PM  Temp    Pulse 97 02/13/2018 12:24 PM  Resp 19 02/13/2018 12:24 PM  SpO2 97 % 02/13/2018 12:24 PM  Vitals shown include unvalidated device data.  Last Pain:  Vitals:   02/13/18 0709  TempSrc: Temporal  PainSc: 0-No pain         Complications: No apparent anesthesia complications

## 2018-02-13 NOTE — Anesthesia Postprocedure Evaluation (Signed)
Anesthesia Post Note  Patient: Omar ReaderDavid K Larson  Procedure(s) Performed: Felizardo HoffmannRICEPS /BICEPS TENDON REPAIR (Left Shoulder)  Patient location during evaluation: PACU Anesthesia Type: General Level of consciousness: awake and alert Pain management: pain level controlled Vital Signs Assessment: post-procedure vital signs reviewed and stable Respiratory status: spontaneous breathing, nonlabored ventilation and respiratory function stable Cardiovascular status: blood pressure returned to baseline and stable Postop Assessment: no apparent nausea or vomiting Anesthetic complications: no     Last Vitals:  Vitals:   02/13/18 1252 02/13/18 1307  BP: 110/71 114/82  Pulse: 97 83  Resp: 19 10  Temp:    SpO2: 98% 99%    Last Pain:  Vitals:   02/13/18 1310  TempSrc:   PainSc: 7                  Jovita GammaKathryn L Fitzgerald

## 2018-02-13 NOTE — OR Nursing (Signed)
Pt requests more pain medicine, per MD per OR nurse call anesthesia. Spoke with Dr. Sampson GoonFitzgerald, ok to give additional oxycodone.

## 2018-02-13 NOTE — Discharge Instructions (Signed)

## 2018-02-13 NOTE — Anesthesia Post-op Follow-up Note (Signed)
Anesthesia QCDR form completed.        

## 2018-02-13 NOTE — H&P (Signed)
PREOPERATIVE H&P  Chief Complaint: RUPTURE OF LEFT PECTORALIS MAJOR MUSCLE  HPI: Omar Larson is a 42 y.o. male who presents for preoperative history and physical with a diagnosis of RUPTURE OF LEFT PECTORALIS MAJOR MUSCLE. Symptoms of weakness and limited ROM are present.  This is significantly impairing activities of daily living. Surgery has been recommended for this young active man.  He has elected for surgical management.   Past Medical History:  Diagnosis Date  . GERD (gastroesophageal reflux disease)    OCC  . History of kidney stones   . Irregular heart beat    Past Surgical History:  Procedure Laterality Date  . EYE SURGERY     LASIK  . KNEE SURGERY    . LITHOTRIPSY     Social History   Socioeconomic History  . Marital status: Married    Spouse name: Not on file  . Number of children: Not on file  . Years of education: Not on file  . Highest education level: Not on file  Occupational History  . Not on file  Social Needs  . Financial resource strain: Not on file  . Food insecurity:    Worry: Not on file    Inability: Not on file  . Transportation needs:    Medical: Not on file    Non-medical: Not on file  Tobacco Use  . Smoking status: Former Smoker    Packs/day: 1.00    Years: 5.00    Pack years: 5.00    Types: Cigarettes    Last attempt to quit: 02/12/2007    Years since quitting: 11.0  . Smokeless tobacco: Never Used  Substance and Sexual Activity  . Alcohol use: Yes    Comment: RUM AND COKE DAILY  . Drug use: Never  . Sexual activity: Not on file  Lifestyle  . Physical activity:    Days per week: Not on file    Minutes per session: Not on file  . Stress: Not on file  Relationships  . Social connections:    Talks on phone: Not on file    Gets together: Not on file    Attends religious service: Not on file    Active member of club or organization: Not on file    Attends meetings of clubs or organizations: Not on file    Relationship  status: Not on file  Other Topics Concern  . Not on file  Social History Narrative  . Not on file   History reviewed. No pertinent family history. Allergies  Allergen Reactions  . Honey Anaphylaxis  . Tape Rash   Prior to Admission medications   Medication Sig Start Date End Date Taking? Authorizing Provider  calcium carbonate (TUMS - DOSED IN MG ELEMENTAL CALCIUM) 500 MG chewable tablet Chew 2 tablets by mouth daily as needed for indigestion or heartburn.   Yes [provider]  cephALEXin (KEFLEX) 500 MG capsule Take 500 mg by mouth 4 (four) times daily.   Yes [provider]  cyclobenzaprine (FLEXERIL) 10 MG tablet Take 10 mg by mouth at bedtime as needed for muscle spasms. 01/27/18  Yes [provider]  GLUCOSAMINE-CHONDROITIN PO Take 1 tablet by mouth daily.   Yes [provider]  Multiple Vitamin (MULTIVITAMIN WITH MINERALS) TABS tablet Take 1 tablet by mouth daily.   Yes [provider]  Omega-3 Fatty Acids (FISH OIL PO) Take 2 capsules by mouth daily.   Yes [provider]  cetirizine (ZYRTEC) 10 MG tablet Take  10 mg by mouth daily as needed for allergies.    [provider]  ibuprofen (ADVIL,MOTRIN) 200 MG tablet Take 800 mg by mouth every 4 (four) hours as needed for moderate pain.    [provider]     Positive ROS: All other systems have been reviewed and were otherwise negative with the exception of those mentioned in the HPI and as above.  Physical Exam: General: Alert, no acute distress Cardiovascular: Regular rate and rhythm, no murmurs rubs or gallops.  No pedal edema Respiratory: Clear to auscultation bilaterally, no wheezes rales or rhonchi. No cyanosis, no use of accessory musculature GI: No organomegaly, abdomen is soft and non-tender nondistended with positive bowel sounds. Skin: Skin intact, no lesions within the operative field. Neurologic: Sensation intact distally Psychiatric: Patient  is competent for consent with normal mood and affect Lymphatic: No  cervical lymphadenopathy  MUSCULOSKELETAL: Left suhoulder:  Patient has a visual defect of the of left pectoralis muscle.  The muscle retracts with activation consistent with a tear.   He has pain with resisted internal rotation.  Forward elevation and abduction are painful.  He is NVI.    Assessment: RUPTURE OF LEFT PECTORALIS MAJOR MUSCLE  Plan: Plan for Procedure(s): PECTORALIS MAJOR TENDON REPAIR, LEFT SHOULDER  I have reviewed the details of the operation and the post-op course with the patient and his wife.    I discussed the risks and benefits of surgery. The risks include but are not limited to infection, bleeding requiring blood transfusion, nerve or blood vessel injury, joint stiffness or loss of motion, persistent pain, weakness or instability, retear of the pectoralis major tendon and hardware failure and the need for further surgery. Medical risks include but are not limited to DVT and pulmonary embolism, myocardial infarction, stroke, pneumonia, respiratory failure and death. Patient understood these risks and wished to proceed.    Juanell FairlyKRASINSKI, Helina Hullum, MD   02/13/2018 9:02 AM

## 2018-02-13 NOTE — Op Note (Signed)
02/13/2018  12:50 PM  PATIENT:  Omar Larson    PRE-OPERATIVE DIAGNOSIS:  RUPTURE OF PECTORALIS MAJOR MUSCLE, LEFT SHOULDER  POST-OPERATIVE DIAGNOSIS:  Same  PROCEDURE:  OPEN PECTORALIS MAJOR TENDON REPAIR, LEFT SHOULDER  SURGEON:  Thornton Park, MD  ASSISTANT:  Wyatt Portela, PA  ANESTHESIA:   General  PREOPERATIVE INDICATIONS:  Omar Larson is a  42 y.o. male with a diagnosis of RUPTURE OF PECTORALIS MAJOR MUSCLE, LEFT SHOULDER who failed conservative measures and elected for surgical management.    I discussed the risks and benefits of surgery. The risks include but are not limited to infection, bleeding requiring blood transfusion, nerve or blood vessel injury, joint stiffness or loss of motion, persistent pain, weakness or instability, malunion, nonunion and hardware failure and the need for further surgery. Medical risks include but are not limited to DVT and pulmonary embolism, myocardial infarction, stroke, pneumonia, respiratory failure and death. Patient understood these risks and wished to proceed.    OPERATIVE IMPLANTS: Arthrex Pectoralis Buttons x 4  OPERATIVE FINDINGS: Tear of left pectoralis major muscle  OPERATIVE PROCEDURE: Patient was met in the preoperative area.  A preop H&P was performed.  I answered all questions by the patient and his wife who was at the bedside.  Marked the left shoulder with the word yes according to the hospitals correct site of surgery protocol.  She was brought to the operating room.  He underwent general anesthesia.  He was positioned in a beachchair using a spider arm positioner.  Patient was positioned at approximately 45 degrees.  He was prepped and draped in a sterile fashion.  A timeout was performed to verify the patient's name, date of birth, medical record number, correct site of surgery and correct procedure to be performed.  Once all in attendance were in agreement the case began.  Patient received 2 g of Ancef prior to  incision.  Bony landmarks were drawn out with a surgical marker.  An incision was made between the coracoid process and axilla.  This was medial to the standard deltopectoral approach.  The soft tissue was dissected using electrocautery as well as a Metzenbaum scissor and pickup.  Bleeding vessels were cauterized with electrocautery.  The clavipectoral fascia was identified and sharply incised with the Metzenbaum scissor.  A small Richardson retractor was used to retract the clavicular portion of the pectoralis major superiorly.  The torn portion of the pectoralis tendon was then easily identified.  Blunt dissection was used superior and deep to the pectoralis major muscle to allow for mobilization.  For fiber tape sutures were placed into the pectoralis tendon in a Krakw fashion.  A small (15 mm) intramuscular tear medial to the tendinous portion of the pectoralis major was also seen and repaired with 5 FiberWire.  A Bennett retractor was then placed under the deltoid muscle around the lateral cortex of the humerus to allow for visualization of the insertion site of the pectoralis major tendon.  This was lateral to the long head of the biceps tendon.  The insertion site was cleaned of residual scar tissue and torn fibers of the pectoralis major.  This area was gently burred to allow for punctate bleeding.  For 3.5 mm holes were drilled and an offset fashion in the footprint.  Four pectoralis major buttons were then loaded with the fiber tape suture limbs.  These 4 buttons were then placed in the 4 corresponding hole was created and the footprint on the anterior proximal humerus.  Once these buttons were flipped inside the humerus the sutures were then tensioned to allow for anatomic reduction of the pectoralis major tendon onto the anterior humerus.  The sutures were then hand tied.  An anatomic reduction was achieved.  The wound was copiously irrigated.  All retractors were removed.  The subcutaneous tissue  was closed with 0 and 3-0 Vicryl.  The skin was closed with a running 4-0 Monocryl.  Steri-Strips were applied along with a dry sterile dressing.  Patient was then placed in an abduction sling with a Polar Care sleeve over the left shoulder.  He was awoken and brought to the PACU in stable condition.    All sharp, instrument and sponge counts were correct at the conclusion the case.  I was scrubbed and present for the entire case.  I spoke with the patient's wife in the postoperative consultation room to let her know the case had been performed without complication and her husband was stable in the recovery room.

## 2018-02-14 ENCOUNTER — Encounter: Payer: Self-pay | Admitting: Orthopedic Surgery

## 2019-08-25 ENCOUNTER — Ambulatory Visit (INDEPENDENT_AMBULATORY_CARE_PROVIDER_SITE_OTHER): Payer: BC Managed Care – PPO

## 2019-08-25 ENCOUNTER — Encounter: Payer: Self-pay | Admitting: Cardiology

## 2019-08-25 ENCOUNTER — Other Ambulatory Visit: Payer: Self-pay

## 2019-08-25 ENCOUNTER — Ambulatory Visit (INDEPENDENT_AMBULATORY_CARE_PROVIDER_SITE_OTHER): Payer: BC Managed Care – PPO | Admitting: Cardiology

## 2019-08-25 VITALS — BP 130/100 | HR 88 | Ht 71.0 in | Wt 264.0 lb

## 2019-08-25 DIAGNOSIS — K2100 Gastro-esophageal reflux disease with esophagitis, without bleeding: Secondary | ICD-10-CM

## 2019-08-25 DIAGNOSIS — R002 Palpitations: Secondary | ICD-10-CM | POA: Diagnosis not present

## 2019-08-25 NOTE — Progress Notes (Signed)
Cardiology Office Note:    Date:  08/25/2019   ID:  OSHUA Larson, DOB Apr 09, 1975, MRN [id number]  PCP:  Laneta Simmers, NP  Cardiologist:  Kate Sable, MD  Electrophysiologist:  None   Referring MD: Rutherford Limerick, PA   Chief Complaint  Patient presents with  . New Patient (Initial Visit)    Referred for chest pain and chest pressure. Patient c.o chest fluttering. Meds reviewed verbally with patient.    Omar Larson is a 44 y.o. male who is being seen today for the evaluation of palpitations at the request of Rutherford Limerick, [place].   History of Present Illness:    Omar Larson is a 44 y.o. male with a hx of GERD, who presents due to palpitations.  Patient states he has had history of palpitations since he was a child but has noticed symptoms are worsening of late.  He has episodes of irregular/fast heartbeats lasting couple of minutes at a time.  Symptoms are not related with exertion and sometimes occur at rest.  Symptoms alcohol about 2-3 times a week, 4-5 times daily.  He usually tries to calm down to help with his symptoms.  Symptoms are sometimes associated with chest pressure and dizziness.  He denies presyncope or syncope.  He otherwise feels fine, able to perform all his activities of daily living without any limitations.  Was recently told by his primary care provider he needs to be on a cholesterol medication.  He however states his blood work was not fasting.  Past Medical History:  Diagnosis Date  . GERD (gastroesophageal reflux disease)    OCC  . History of kidney stones   . Irregular heart beat     Past Surgical History:  Procedure Laterality Date  . EYE SURGERY     LASIK  . KNEE SURGERY    . LITHOTRIPSY    . TRICEPS TENDON REPAIR Left 02/13/2018   Procedure: TRICEPS /BICEPS TENDON REPAIR;  Surgeon: Thornton Park, MD;  Location: [facility] ORS;  Service: Orthopedics;  Laterality: Left;    Current Medications: Current Meds  Medication Sig   . calcium carbonate (TUMS - DOSED IN MG ELEMENTAL CALCIUM) 500 MG chewable tablet Chew 2 tablets by mouth daily as needed for indigestion or heartburn.  . cetirizine (ZYRTEC) 10 MG tablet Take 10 mg by mouth daily as needed for allergies.  Marland Kitchen GLUCOSAMINE-CHONDROITIN PO Take 1 tablet by mouth daily.  . Multiple Vitamin (MULTIVITAMIN WITH MINERALS) TABS tablet Take 1 tablet by mouth daily.  . Omega-3 Fatty Acids (FISH OIL PO) Take 2 capsules by mouth daily.     Allergies:   Honey and Tape   Social History   Socioeconomic History  . Marital status: Married    Spouse name: Not on file  . Number of children: Not on file  . Years of education: Not on file  . Highest education level: Not on file  Occupational History  . Not on file  Tobacco Use  . Smoking status: Former Smoker    Packs/day: 1.00    Years: 5.00    Pack years: 5.00    Types: Cigarettes    Quit date: 02/12/2007    Years since quitting: 12.5  . Smokeless tobacco: Never Used  Substance and Sexual Activity  . Alcohol use: Yes    Comment: RUM AND COKE DAILY  . Drug use: Never  . Sexual activity: Not on file  Other Topics Concern  . Not on file  Social  History Narrative  . Not on file   Social Determinants of Health   Financial Resource Strain:   . Difficulty of Paying Living Expenses:   Food Insecurity:   . Worried About Programme researcher, broadcasting/film/video in the Last Year:   . Barista in the Last Year:   Transportation Needs:   . Freight forwarder (Medical):   Marland Kitchen Lack of Transportation (Non-Medical):   Physical Activity:   . Days of Exercise per Week:   . Minutes of Exercise per Session:   Stress:   . Feeling of Stress :   Social Connections:   . Frequency of Communication with Friends and Family:   . Frequency of Social Gatherings with Friends and Family:   . Attends Religious Services:   . Active Member of Clubs or Organizations:   . Attends Banker Meetings:   Marland Kitchen Marital Status:       Family History: The patient's family history includes Stroke in his father.  ROS:   Please see the history of present illness.     All other systems reviewed and are negative.  EKGs/Labs/Other Studies Reviewed:    The following studies were reviewed today:   EKG:  EKG is  ordered today.  The ekg ordered today demonstrates normal sinus rhythm, normal ECG.  Recent Labs: No results found for requested labs within last 8760 hours.  Recent Lipid Panel No results found for: CHOL, TRIG, HDL, CHOLHDL, VLDL, LDLCALC, LDLDIRECT  Physical Exam:    VS:  BP (!) 130/100 (BP Location: Right Arm, Patient Position: Sitting, Cuff Size: Normal)   Pulse 88   Ht 5\' 11"  (1.803 m)   Wt 264 lb (119.7 kg)   SpO2 98%   BMI 36.82 kg/m     Wt Readings from Last 3 Encounters:  08/25/19 264 lb (119.7 kg)  02/13/18 258 lb 4 oz (117.1 kg)     GEN:  Well nourished, well developed in no acute distress HEENT: Normal NECK: No JVD; No carotid bruits LYMPHATICS: No lymphadenopathy CARDIAC: RRR, no murmurs, rubs, gallops RESPIRATORY:  Clear to auscultation without rales, wheezing or rhonchi  ABDOMEN: Soft, non-tender, non-distended MUSCULOSKELETAL:  No edema; No deformity  SKIN: Warm and dry NEUROLOGIC:  Alert and oriented x 3 PSYCHIATRIC:  Normal affect   ASSESSMENT:    1. Palpitations   2. Gastroesophageal reflux disease with esophagitis without hemorrhage    PLAN:    In order of problems listed above:  1. Patient with history of palpitations, associated with chest discomfort and dizziness.  Will evaluate patient with a cardiac monitor x2 weeks for any significant arrhythmias.  He was encouraged to follow-up with primary care provider regarding lipid results and management.  Will fax over results of lipid panel when obtained. 2. He has a history of reflux, recommend over-the-counter Prilosec daily.  Occasional OTC Tums sometimes helps with his symptoms.  Follow-up after cardiac  monitor.  This note was generated in part or whole with voice recognition software. Voice recognition is usually quite accurate but there are transcription errors that can and very often do occur. I apologize for any typographical errors that were not detected and corrected.  Medication Adjustments/Labs and Tests Ordered: Current medicines are reviewed at length with the patient today.  Concerns regarding medicines are outlined above.  Orders Placed This Encounter  Procedures  . LONG TERM MONITOR (3-14 DAYS)  . EKG 12-Lead   No orders of the defined types were placed in this  encounter.   Patient Instructions  Medication Instructions:  No Changes *If you need a refill on your cardiac medications before your next appointment, please call your pharmacy*   Lab Work: None Ordered If you have labs (blood work) drawn today and your tests are completely normal, you will receive your results only by: Marland Kitchen MyChart Message (if you have MyChart) OR . A paper copy in the mail If you have any lab test that is abnormal or we need to change your treatment, we will call you to review the results.   Testing/Procedures:  Your physician has recommended that you wear a Zio monitor. This monitor is a medical device that records the heart's electrical activity. Doctors most often use these monitors to diagnose arrhythmias. Arrhythmias are problems with the speed or rhythm of the heartbeat. The monitor is a small device applied to your chest. You can wear one while you do your normal daily activities. While wearing this monitor if you have any symptoms to push the button and record what you felt. Once you have worn this monitor for the period of time provider prescribed (Usually 14 days), you will return the monitor device in the postage paid box. Once it is returned they will download the data collected and provide [place] with a report which the provider will then review and we will call you with those results.  Important tips:  1. Avoid showering during the first 24 hours of wearing the monitor. 2. Avoid excessive sweating to help maximize wear time. 3. Do not submerge the device, no hot tubs, and no swimming pools. 4. Keep any lotions or oils away from the patch. 5. After 24 hours you may shower with the patch on. Take brief showers with your back facing the shower head.  6. Do not remove patch once it has been placed because that will interrupt data and decrease adhesive wear time. 7. Push the button when you have any symptoms and write down what you were feeling. 8. Once you have completed wearing your monitor, remove and place into box which has postage paid and place in your outgoing mailbox.  9. If for some reason you have misplaced your box then call our office and we can provide another box and/or mail it off for you.         Follow-Up: At [facility], you and your health needs are our priority.  As part of our continuing mission to provide you with exceptional heart care, we have created designated Provider Care Teams.  These Care Teams include your primary Cardiologist (physician) and Advanced Practice Providers (APPs -  Physician Assistants and Nurse Practitioners) who all work together to provide you with the care you need, when you need it.  We recommend signing up for the patient portal called "MyChart".  Sign up information is provided on this After Visit Summary.  MyChart is used to connect with patients for Virtual Visits (Telemedicine).  Patients are able to view lab/test results, encounter notes, upcoming appointments, etc.  Non-urgent messages can be sent to your provider as well.   To learn more about what you can do with MyChart, go to [email or web address].    Your next appointment:   5 -6 weeks  After Zio results. The format for your next appointment:   In Person  Provider:   Debbe Odea, MD   Other Instructions N/A     Signed, Debbe Odea, MD   08/25/2019 10:28 AM    [facility]  [facility]

## 2019-08-25 NOTE — Patient Instructions (Signed)
Medication Instructions:  No Changes *If you need a refill on your cardiac medications before your next appointment, please call your pharmacy*   Lab Work: None Ordered If you have labs (blood work) drawn today and your tests are completely normal, you will receive your results only by: Marland Kitchen MyChart Message (if you have MyChart) OR . A paper copy in the mail If you have any lab test that is abnormal or we need to change your treatment, we will call you to review the results.   Testing/Procedures:  Your physician has recommended that you wear a Zio monitor. This monitor is a medical device that records the heart's electrical activity. Doctors most often use these monitors to diagnose arrhythmias. Arrhythmias are problems with the speed or rhythm of the heartbeat. The monitor is a small device applied to your chest. You can wear one while you do your normal daily activities. While wearing this monitor if you have any symptoms to push the button and record what you felt. Once you have worn this monitor for the period of time provider prescribed (Usually 14 days), you will return the monitor device in the postage paid box. Once it is returned they will download the data collected and provide [place] with a report which the provider will then review and we will call you with those results. Important tips:  1. Avoid showering during the first 24 hours of wearing the monitor. 2. Avoid excessive sweating to help maximize wear time. 3. Do not submerge the device, no hot tubs, and no swimming pools. 4. Keep any lotions or oils away from the patch. 5. After 24 hours you may shower with the patch on. Take brief showers with your back facing the shower head.  6. Do not remove patch once it has been placed because that will interrupt data and decrease adhesive wear time. 7. Push the button when you have any symptoms and write down what you were feeling. 8. Once you have completed wearing your monitor, remove and  place into box which has postage paid and place in your outgoing mailbox.  9. If for some reason you have misplaced your box then call our office and we can provide another box and/or mail it off for you.         Follow-Up: At [facility], you and your health needs are our priority.  As part of our continuing mission to provide you with exceptional heart care, we have created designated Provider Care Teams.  These Care Teams include your primary Cardiologist (physician) and Advanced Practice Providers (APPs -  Physician Assistants and Nurse Practitioners) who all work together to provide you with the care you need, when you need it.  We recommend signing up for the patient portal called "MyChart".  Sign up information is provided on this After Visit Summary.  MyChart is used to connect with patients for Virtual Visits (Telemedicine).  Patients are able to view lab/test results, encounter notes, upcoming appointments, etc.  Non-urgent messages can be sent to your provider as well.   To learn more about what you can do with MyChart, go to [email or web address].    Your next appointment:   5 -6 weeks  After Zio results. The format for your next appointment:   In Person  Provider:   Debbe Odea, MD   Other Instructions N/A

## 2019-10-02 ENCOUNTER — Encounter: Payer: Self-pay | Admitting: Cardiology

## 2019-10-08 ENCOUNTER — Ambulatory Visit (INDEPENDENT_AMBULATORY_CARE_PROVIDER_SITE_OTHER): Payer: BC Managed Care – PPO | Admitting: Cardiology

## 2019-10-08 ENCOUNTER — Other Ambulatory Visit: Payer: Self-pay

## 2019-10-08 ENCOUNTER — Encounter: Payer: Self-pay | Admitting: Cardiology

## 2019-10-08 VITALS — BP 126/100 | HR 74 | Ht 71.0 in | Wt 270.0 lb

## 2019-10-08 DIAGNOSIS — I471 Supraventricular tachycardia: Secondary | ICD-10-CM

## 2019-10-08 DIAGNOSIS — E7800 Pure hypercholesterolemia, unspecified: Secondary | ICD-10-CM

## 2019-10-08 DIAGNOSIS — K2100 Gastro-esophageal reflux disease with esophagitis, without bleeding: Secondary | ICD-10-CM | POA: Diagnosis not present

## 2019-10-08 MED ORDER — METOPROLOL SUCCINATE ER 50 MG PO TB24: 25.0000 mg | ORAL_TABLET | Freq: Every day | ORAL | 6 refills | Status: DC

## 2019-10-08 NOTE — Patient Instructions (Signed)
Medication Instructions:   1.  PLEASE CALL us with your Gemfibrozil dose you are taking.   2. START  Taking Toprol XL:  Take 0.5 tablets (25 mg total) by mouth daily.  *If you need a refill on your cardiac medications before your next appointment, please call your pharmacy*   Lab Work: None Ordered. If you have labs (blood work) drawn today and your tests are completely normal, you will receive your results only by: Marland Kitchen MyChart Message (if you have MyChart) OR . A paper copy in the mail If you have any lab test that is abnormal or we need to change your treatment, we will call you to review the results.   Testing/Procedures:  Your physician has requested that you have an echocardiogram. Echocardiography is a painless test that uses sound waves to create images of your heart. It provides your doctor with information about the size and shape of your heart and how well your heart's chambers and valves are working. This procedure takes approximately one hour. There are no restrictions for this procedure.     Follow-Up: At [facility], you and your health needs are our priority.  As part of our continuing mission to provide you with exceptional heart care, we have created designated Provider Care Teams.  These Care Teams include your primary Cardiologist (physician) and Advanced Practice Providers (APPs -  Physician Assistants and Nurse Practitioners) who all work together to provide you with the care you need, when you need it.  We recommend signing up for the patient portal called "MyChart".  Sign up information is provided on this After Visit Summary.  MyChart is used to connect with patients for Virtual Visits (Telemedicine).  Patients are able to view lab/test results, encounter notes, upcoming appointments, etc.  Non-urgent messages can be sent to your provider as well.   To learn more about what you can do with MyChart, go to [email or web address].    Your next appointment:     Follow up after Echo   The format for your next appointment:   In Person  Provider:   Debbe Odea, MD   Other Instructions   Echocardiogram An echocardiogram is a procedure that uses painless sound waves (ultrasound) to produce an image of the heart. Images from an echocardiogram can provide important information about:  Signs of coronary artery disease (CAD).  Aneurysm detection. An aneurysm is a weak or damaged part of an artery wall that bulges out from the normal force of blood pumping through the body.  Heart size and shape. Changes in the size or shape of the heart can be associated with certain conditions, including heart failure, aneurysm, and CAD.  Heart muscle function.  Heart valve function.  Signs of a past heart attack.  Fluid buildup around the heart.  Thickening of the heart muscle.  A tumor or infectious growth around the heart valves. Tell a health care provider about:  Any allergies you have.  All medicines you are taking, including vitamins, herbs, eye drops, creams, and over-the-counter medicines.  Any blood disorders you have.  Any surgeries you have had.  Any medical conditions you have.  Whether you are pregnant or may be pregnant. What are the risks? Generally, this is a safe procedure. However, problems may occur, including:  Allergic reaction to dye (contrast) that may be used during the procedure. What happens before the procedure? No specific preparation is needed. You may eat and drink normally. What happens during the procedure?   An  IV tube may be inserted into one of your veins.  You may receive contrast through this tube. A contrast is an injection that improves the quality of the pictures from your heart.  A gel will be applied to your chest.  A wand-like tool (transducer) will be moved over your chest. The gel will help to transmit the sound waves from the transducer.  The sound waves will harmlessly bounce off  of your heart to allow the heart images to be captured in real-time motion. The images will be recorded on a computer. The procedure may vary among health care providers and hospitals. What happens after the procedure?  You may return to your normal, everyday life, including diet, activities, and medicines, unless your health care provider tells you not to do that. Summary  An echocardiogram is a procedure that uses painless sound waves (ultrasound) to produce an image of the heart.  Images from an echocardiogram can provide important information about the size and shape of your heart, heart muscle function, heart valve function, and fluid buildup around your heart.  You do not need to do anything to prepare before this procedure. You may eat and drink normally.  After the echocardiogram is completed, you may return to your normal, everyday life, unless your health care provider tells you not to do that. This information is not intended to replace advice given to you by your health care provider. Make sure you discuss any questions you have with your health care provider. Document Revised: 07/10/2018 Document Reviewed: 04/21/2016 Elsevier Patient Education  2020 [employer].

## 2019-10-08 NOTE — Progress Notes (Addendum)
Cardiology Office Note:    Date:  10/08/2019   ID:  Omar Larson, DOB 24-Mar-1976, MRN [id number]  PCP:  Evie Lacks, NP  Cardiologist:  Debbe Odea, MD  Electrophysiologist:  None   Referring MD: Evie Lacks, NP   Chief Complaint  Patient presents with  . office visit    F/U after wearing ZIO monitor-Patient reports improvement in symptoms. Meds verbally reviewed with patient.    History of Present Illness:    Omar Larson is a 44 y.o. male with a hx of GERD, who presents for follow-up.  He was last seen due to palpitations.  He has had palpitations since his childhood.  Symptoms have worsened of late, sometimes associated with chest pressure.  2-week Cardiac monitor was placed to evaluate any significant arrhythmias.  Patient now presents for results.  He states feeling a little better since cutting back on caffeine and alcohol.  He was recently started on gemfibrozil due to abnormal cholesterol lab work.  He otherwise is doing okay.  Past Medical History:  Diagnosis Date  . GERD (gastroesophageal reflux disease)    OCC  . History of kidney stones   . Irregular heart beat     Past Surgical History:  Procedure Laterality Date  . EYE SURGERY     LASIK  . KNEE SURGERY    . LITHOTRIPSY    . TRICEPS TENDON REPAIR Left 02/13/2018   Procedure: TRICEPS /BICEPS TENDON REPAIR;  Surgeon: Juanell Fairly, MD;  Location: [facility] ORS;  Service: Orthopedics;  Laterality: Left;    Current Medications: Current Meds  Medication Sig  . calcium carbonate (TUMS - DOSED IN MG ELEMENTAL CALCIUM) 500 MG chewable tablet Chew 2 tablets by mouth daily as needed for indigestion or heartburn.  . cetirizine (ZYRTEC) 10 MG tablet Take 10 mg by mouth daily as needed for allergies.  Marland Kitchen GLUCOSAMINE-CHONDROITIN PO Take 1 tablet by mouth daily.  . Multiple Vitamin (MULTIVITAMIN WITH MINERALS) TABS tablet Take 1 tablet by mouth daily.  . Omega-3 Fatty Acids (FISH OIL PO) Take 2  capsules by mouth daily.     Allergies:   Honey and Tape   Social History   Socioeconomic History  . Marital status: Married    Spouse name: Not on file  . Number of children: Not on file  . Years of education: Not on file  . Highest education level: Not on file  Occupational History  . Not on file  Tobacco Use  . Smoking status: Former Smoker    Packs/day: 1.00    Years: 5.00    Pack years: 5.00    Types: Cigarettes    Quit date: 02/12/2007    Years since quitting: 12.6  . Smokeless tobacco: Never Used  Vaping Use  . Vaping Use: Never used  Substance and Sexual Activity  . Alcohol use: Yes    Comment: RUM AND COKE DAILY  . Drug use: Never  . Sexual activity: Not on file  Other Topics Concern  . Not on file  Social History Narrative  . Not on file   Social Determinants of Health   Financial Resource Strain:   . Difficulty of Paying Living Expenses:   Food Insecurity:   . Worried About Programme researcher, broadcasting/film/video in the Last Year:   . Barista in the Last Year:   Transportation Needs:   . Freight forwarder (Medical):   Marland Kitchen Lack of Transportation (Non-Medical):   Physical Activity:   .  Days of Exercise per Week:   . Minutes of Exercise per Session:   Stress:   . Feeling of Stress :   Social Connections:   . Frequency of Communication with Friends and Family:   . Frequency of Social Gatherings with Friends and Family:   . Attends Religious Services:   . Active Member of Clubs or Organizations:   . Attends Banker Meetings:   Marland Kitchen Marital Status:      Family History: The patient's family history includes Stroke in his father.  ROS:   Please see the history of present illness.     All other systems reviewed and are negative.  EKGs/Labs/Other Studies Reviewed:    The following studies were reviewed today:   EKG:  EKG is  ordered today.  The ekg ordered today demonstrates normal sinus rhythm, normal ECG.  Recent Labs: No results found  for requested labs within last 8760 hours.  Recent Lipid Panel No results found for: CHOL, TRIG, HDL, CHOLHDL, VLDL, LDLCALC, LDLDIRECT  Physical Exam:    VS:  BP (!) 126/100 (BP Location: Left Arm, Patient Position: Sitting, Cuff Size: Large)   Pulse 74   Ht 5\' 11"  (1.803 m)   Wt 270 lb (122.5 kg)   SpO2 98%   BMI 37.66 kg/m     Wt Readings from Last 3 Encounters:  10/08/19 270 lb (122.5 kg)  08/25/19 264 lb (119.7 kg)  02/13/18 258 lb 4 oz (117.1 kg)     GEN:  Well nourished, well developed in no acute distress HEENT: Normal NECK: No JVD; No carotid bruits LYMPHATICS: No lymphadenopathy CARDIAC: RRR, no murmurs, rubs, gallops RESPIRATORY:  Clear to auscultation without rales, wheezing or rhonchi  ABDOMEN: Soft, non-tender, non-distended MUSCULOSKELETAL:  No edema; No deformity  SKIN: Warm and dry NEUROLOGIC:  Alert and oriented x 3 PSYCHIATRIC:  Normal affect   ASSESSMENT:    1. Paroxysmal SVT (supraventricular tachycardia) (HCC)   2. Pure hypercholesterolemia   3. Gastroesophageal reflux disease with esophagitis without hemorrhage    PLAN:    In order of problems listed above:  1. Patient with history of palpitations, associated with chest discomfort and dizziness.  2-week cardiac monitor showed occasional runs of paroxysmal SVTs, up to 18 beats noted, associated with patient triggered events.  Get echocardiogram.  Start Toprol-XL 25 mg daily. 2. History of hyperlipidemia, recent labs obtained by PCP showing total chol 221, LDL 155, Trig 182.  10-year ASCVD risk is 2.5%. patient was started on gemfibrozil 600mg  bid.  Low cholesterol diet advised.  Typically, will recommend statin as initial therapy if medical treatment is considered. 3. He has a history of reflux, continue OTC Prilosec.  Follow-up after echocardiogram. .  This note was generated in part or whole with voice recognition software. Voice recognition is usually quite accurate but there are transcription  errors that can and very often do occur. I apologize for any typographical errors that were not detected and corrected.  Medication Adjustments/Labs and Tests Ordered: Current medicines are reviewed at length with the patient today.  Concerns regarding medicines are outlined above.  Orders Placed This Encounter  Procedures  . EKG 12-Lead  . ECHOCARDIOGRAM COMPLETE   Meds ordered this encounter  Medications  . metoprolol succinate (TOPROL-XL) 50 MG 24 hr tablet    Sig: Take 0.5 tablets (25 mg total) by mouth daily. Take with or immediately following a meal.    Dispense:  30 tablet    Refill:  6  Patient Instructions  Medication Instructions:   1.  PLEASE CALL us with your Gemfibrozil dose you are taking.   2. START  Taking Toprol XL:  Take 0.5 tablets (25 mg total) by mouth daily.  *If you need a refill on your cardiac medications before your next appointment, please call your pharmacy*   Lab Work: None Ordered. If you have labs (blood work) drawn today and your tests are completely normal, you will receive your results only by: Marland Kitchen MyChart Message (if you have MyChart) OR . A paper copy in the mail If you have any lab test that is abnormal or we need to change your treatment, we will call you to review the results.   Testing/Procedures:  Your physician has requested that you have an echocardiogram. Echocardiography is a painless test that uses sound waves to create images of your heart. It provides your doctor with information about the size and shape of your heart and how well your heart's chambers and valves are working. This procedure takes approximately one hour. There are no restrictions for this procedure.     Follow-Up: At [facility], you and your health needs are our priority.  As part of our continuing mission to provide you with exceptional heart care, we have created designated Provider Care Teams.  These Care Teams include your primary Cardiologist  (physician) and Advanced Practice Providers (APPs -  Physician Assistants and Nurse Practitioners) who all work together to provide you with the care you need, when you need it.  We recommend signing up for the patient portal called "MyChart".  Sign up information is provided on this After Visit Summary.  MyChart is used to connect with patients for Virtual Visits (Telemedicine).  Patients are able to view lab/test results, encounter notes, upcoming appointments, etc.  Non-urgent messages can be sent to your provider as well.   To learn more about what you can do with MyChart, go to [email or web address].    Your next appointment:   Follow up after Echo   The format for your next appointment:   In Person  Provider:   Debbe Odea, MD   Other Instructions   Echocardiogram An echocardiogram is a procedure that uses painless sound waves (ultrasound) to produce an image of the heart. Images from an echocardiogram can provide important information about:  Signs of coronary artery disease (CAD).  Aneurysm detection. An aneurysm is a weak or damaged part of an artery wall that bulges out from the normal force of blood pumping through the body.  Heart size and shape. Changes in the size or shape of the heart can be associated with certain conditions, including heart failure, aneurysm, and CAD.  Heart muscle function.  Heart valve function.  Signs of a past heart attack.  Fluid buildup around the heart.  Thickening of the heart muscle.  A tumor or infectious growth around the heart valves. Tell a health care provider about:  Any allergies you have.  All medicines you are taking, including vitamins, herbs, eye drops, creams, and over-the-counter medicines.  Any blood disorders you have.  Any surgeries you have had.  Any medical conditions you have.  Whether you are pregnant or may be pregnant. What are the risks? Generally, this is a safe procedure. However, problems  may occur, including:  Allergic reaction to dye (contrast) that may be used during the procedure. What happens before the procedure? No specific preparation is needed. You may eat and drink normally. What happens during the procedure?  An IV tube may be inserted into one of your veins.  You may receive contrast through this tube. A contrast is an injection that improves the quality of the pictures from your heart.  A gel will be applied to your chest.  A wand-like tool (transducer) will be moved over your chest. The gel will help to transmit the sound waves from the transducer.  The sound waves will harmlessly bounce off of your heart to allow the heart images to be captured in real-time motion. The images will be recorded on a computer. The procedure may vary among health care providers and hospitals. What happens after the procedure?  You may return to your normal, everyday life, including diet, activities, and medicines, unless your health care provider tells you not to do that. Summary  An echocardiogram is a procedure that uses painless sound waves (ultrasound) to produce an image of the heart.  Images from an echocardiogram can provide important information about the size and shape of your heart, heart muscle function, heart valve function, and fluid buildup around your heart.  You do not need to do anything to prepare before this procedure. You may eat and drink normally.  After the echocardiogram is completed, you may return to your normal, everyday life, unless your health care provider tells you not to do that. This information is not intended to replace advice given to you by your health care provider. Make sure you discuss any questions you have with your health care provider. Document Revised: 07/10/2018 Document Reviewed: 04/21/2016 Elsevier Patient Education  2020 [employer]      Signed, Debbe OdeaBrian Agbor-Etang, MD  10/08/2019 12:43 PM     [facility]

## 2019-11-03 ENCOUNTER — Ambulatory Visit (INDEPENDENT_AMBULATORY_CARE_PROVIDER_SITE_OTHER): Payer: BC Managed Care – PPO

## 2019-11-03 ENCOUNTER — Other Ambulatory Visit: Payer: Self-pay

## 2019-11-03 DIAGNOSIS — I471 Supraventricular tachycardia: Secondary | ICD-10-CM | POA: Diagnosis not present

## 2019-11-03 DIAGNOSIS — E7800 Pure hypercholesterolemia, unspecified: Secondary | ICD-10-CM | POA: Diagnosis not present

## 2019-11-03 LAB — ECHOCARDIOGRAM COMPLETE
AR max vel: 3.65 cm2
AV Area VTI: 3.9 cm2
AV Area mean vel: 3.55 cm2
AV Mean grad: 3 mmHg
AV Peak grad: 6.1 mmHg
Ao pk vel: 1.23 m/s
Area-P 1/2: 5.38 cm2
S' Lateral: 3.7 cm
Single Plane A4C EF: 52 %

## 2019-11-03 MED ORDER — PERFLUTREN LIPID MICROSPHERE
1.0000 mL | INTRAVENOUS | Status: AC | PRN
  Administered 2019-11-03: 2 mL via INTRAVENOUS

## 2019-11-10 ENCOUNTER — Telehealth: Payer: Self-pay

## 2019-11-10 NOTE — Telephone Encounter (Signed)
Patient returning call.

## 2019-11-10 NOTE — Telephone Encounter (Signed)
Spoke with patient and gave him his Echo results. Since results are normal, patient wanted to know if he absolutely needs to come in for follow up on Thursday 8/12 as he works in [place] and this requires a half a day off.  If appointment is necessary at this time he would prefer to do a telephone visit.  He also stated that his Metoprolol he recently started has seemed to be helping and asked if he will be continuing it.  Will forward to Dr. Azucena Cecil for review.

## 2019-11-10 NOTE — Telephone Encounter (Signed)
[facility] for Echo results.

## 2019-11-11 NOTE — Telephone Encounter (Signed)
Patient was called to remind him about appointment. Patient stated that he was told this appointment could be cancelled due to normal echo results. Appointment has been cancelled.  Patient also states he is awaiting a call about his beta blocker

## 2019-11-12 ENCOUNTER — Ambulatory Visit: Payer: BC Managed Care – PPO | Admitting: Cardiology

## 2019-11-12 NOTE — Telephone Encounter (Signed)
Spoke with patient and relayed Dr. Amaryllis Dyke note below.

## 2020-10-18 ENCOUNTER — Other Ambulatory Visit: Payer: Self-pay

## 2020-10-18 MED ORDER — METOPROLOL SUCCINATE ER 50 MG PO TB24: 25.0000 mg | ORAL_TABLET | Freq: Every day | ORAL | 0 refills | Status: AC
